# Patient Record
Sex: Female | Born: 1995 | Race: Asian | Hispanic: No | Marital: Single | Smoking: Never smoker
Health system: Southern US, Community
[De-identification: ages and names within clinical notes are randomized; demographics above are authoritative.]

---

## 2015-07-15 ENCOUNTER — Ambulatory Visit (INDEPENDENT_AMBULATORY_CARE_PROVIDER_SITE_OTHER): Payer: PRIVATE HEALTH INSURANCE | Admitting: Endocrinology

## 2015-07-15 ENCOUNTER — Encounter: Payer: Self-pay | Admitting: Endocrinology

## 2015-07-15 VITALS — BP 102/64 | HR 62 | Temp 97.9°F | Ht 64.0 in | Wt 114.0 lb

## 2015-07-15 DIAGNOSIS — E221 Hyperprolactinemia: Secondary | ICD-10-CM | POA: Diagnosis not present

## 2015-07-15 MED ORDER — BROMOCRIPTINE MESYLATE 2.5 MG PO TABS: 2.5000 mg | ORAL_TABLET | Freq: Every day | ORAL | Status: DC

## 2015-07-15 NOTE — Progress Notes (Signed)
Subjective:    Patient ID: Margaret Lowery, female    DOB: [birth year], 19 y.o.   MRN: [id number]  HPI Pt says she had menarche at age 43.  menses always have been variable, but less often than monthly.  She has slight assoc pain. She is G0.  She has no hair growth on the face.   Pt had menarche at age 25.  She has always had regular menses. She took OC's x a few mos, 2 years ago, but she did not have monthly bleeding then.  She was noted to have an elevated prolactin.  She denies the following: excessive exercise, opiates, antipsychotics, brain XRT, brain surgery, cirrhosis,  thyroid dz, renal failure, brain injury, urolithiasis, chest-wall injury, zoster, and seizures.  No past medical history on file.  No past surgical history on file.  Social History   Social History  . Marital Status: Single    Spouse Name: N/A  . Number of Children: N/A  . Years of Education: N/A   Occupational History  . Not on file.   Social History Main Topics  . Smoking status: Never Smoker   . Smokeless tobacco: Not on file  . Alcohol Use: No  . Drug Use: Not on file  . Sexual Activity: Not on file   Other Topics Concern  . Not on file   Social History Narrative  . No narrative on file    No current outpatient prescriptions on file prior to visit.   No current facility-administered medications on file prior to visit.    No Known Allergies  No family history on file.  BP 102/64 mmHg  Pulse 62  Temp(Src) 97.9 F (36.6 C) (Oral)  Ht  (1.626 m)  Wt 114 lb (51.71 kg)  BMI 19.56 kg/m2  SpO2 98%  LMP 07/02/2015  Review of Systems denies weight change, headache, fever, diarrhea, rash, visual loss, abdominal pain, sob, depression, urinary frequency, arthralgias, galactorrhea, cramps, excessive diaphoresis, n/v, rhinorrhea, easy bruising, and numbness.      Objective:   Physical Exam VS: see vs page GEN: no distress HEAD: head: no deformity eyes: no periorbital swelling, no  proptosis external nose and ears are normal mouth: no lesion seen NECK: supple, thyroid is not enlarged CHEST WALL: no deformity LUNGS:  Clear to auscultation CV: reg rate and rhythm, no murmur ABD: abdomen is soft, nontender.  no hepatosplenomegaly.  not distended.  no hernia MUSCULOSKELETAL: muscle bulk and strength are grossly normal.  no obvious joint swelling.  gait is normal and steady EXTEMITIES: no deformity.  no ulcer on the feet.  feet are of normal color and temp.  no edema PULSES: dorsalis pedis intact bilat.  no carotid bruit NEURO:  cn 2-12 grossly intact.   readily moves all 4's.  sensation is intact to touch on the feet SKIN:  Normal texture and temperature.  No rash or suspicious lesion is visible.   NODES:  None palpable at the neck PSYCH: alert, well-oriented.  Does not appear anxious nor depressed.   outside test results are reviewed: Prolactin=33 TSH=0.9 FSH=9.5 LH=9.9  I have reviewed outside records, and summarized: Pt was noted to have hypomenorrhea, and labs were checked.  When prol was noted to be high, she was advised to see a specialist     Assessment & Plan:  Hyperprolactinemia, mild, new Hypomenorrhea, ? related to prolactin  Patient is advised the following: Patient Instructions  Please start taking "bromocriptine." It has possible side effects of nausea and  dizziness.  These go away with time.  You can avoid these by taking it at bedtime, and by taking just take 1/2 pill for the first week.   This pill will make menstruation or pregnancy more possible, so may need to be prepared for sudden menstruation. Please redo the blood test in 1-2 months.  Please return in 1 year.

## 2015-07-15 NOTE — Patient Instructions (Signed)
Please start taking "bromocriptine." It has possible side effects of nausea and dizziness.  These go away with time.  You can avoid these by taking it at bedtime, and by taking just take 1/2 pill for the first week.   This pill will make menstruation or pregnancy more possible, so may need to be prepared for sudden menstruation. Please redo the blood test in 1-2 months.  Please return in 1 year.

## 2016-01-05 ENCOUNTER — Encounter (HOSPITAL_COMMUNITY): Payer: Self-pay

## 2016-01-05 ENCOUNTER — Emergency Department (HOSPITAL_COMMUNITY): Payer: Managed Care, Other (non HMO)

## 2016-01-05 ENCOUNTER — Emergency Department (HOSPITAL_COMMUNITY)
Admission: EM | Admit: 2016-01-05 | Discharge: 2016-01-06 | Disposition: A | Discharge status: Home / Self Care | Payer: Managed Care, Other (non HMO) | Attending: Emergency Medicine | Admitting: Emergency Medicine

## 2016-01-05 DIAGNOSIS — Y9289 Other specified places as the place of occurrence of the external cause: Secondary | ICD-10-CM | POA: Insufficient documentation

## 2016-01-05 DIAGNOSIS — S4992XA Unspecified injury of left shoulder and upper arm, initial encounter: Secondary | ICD-10-CM | POA: Diagnosis present

## 2016-01-05 DIAGNOSIS — X58XXXA Exposure to other specified factors, initial encounter: Secondary | ICD-10-CM | POA: Insufficient documentation

## 2016-01-05 DIAGNOSIS — Y9339 Activity, other involving climbing, rappelling and jumping off: Secondary | ICD-10-CM | POA: Diagnosis not present

## 2016-01-05 DIAGNOSIS — S43015A Anterior dislocation of left humerus, initial encounter: Secondary | ICD-10-CM | POA: Diagnosis not present

## 2016-01-05 DIAGNOSIS — Y998 Other external cause status: Secondary | ICD-10-CM | POA: Insufficient documentation

## 2016-01-05 MED ORDER — FENTANYL CITRATE (PF) 100 MCG/2ML IJ SOLN: 50.0000 ug | Freq: Once | INTRAMUSCULAR | Status: DC

## 2016-01-05 MED ORDER — ETOMIDATE 2 MG/ML IV SOLN
0.1500 mg/kg | Freq: Once | INTRAVENOUS | Status: AC
  Administered 2016-01-05: 8.16 mg via INTRAVENOUS
  Filled 2016-01-05: qty 10

## 2016-01-05 MED ORDER — FENTANYL CITRATE (PF) 100 MCG/2ML IJ SOLN
100.0000 ug | Freq: Once | INTRAMUSCULAR | Status: AC
Start: 2016-01-05 — End: 2016-01-05
  Administered 2016-01-05: 100 ug via INTRAVENOUS

## 2016-01-05 MED ORDER — FENTANYL CITRATE (PF) 100 MCG/2ML IJ SOLN
50.0000 ug | Freq: Once | INTRAMUSCULAR | Status: DC
  Filled 2016-01-05: qty 2

## 2016-01-05 MED ORDER — ETOMIDATE 2 MG/ML IV SOLN
INTRAVENOUS | Status: AC | PRN
  Administered 2016-01-05: 8.16 mg via INTRAVENOUS

## 2016-01-05 NOTE — ED Provider Notes (Signed)
CSN: [id number]     Arrival date & time 01/05/16  2130 History   First MD Initiated Contact with Patient 01/05/16 2154     Chief Complaint  Patient presents with  . Dislocation  . Shoulder Injury     (Consider location/radiation/quality/duration/timing/severity/associated sxs/prior Treatment) HPI   Patient is a 20 year old female who presents the emergency room with left shoulder pain and deformity that occurred approximately one and half hours ago while she was rock climbing.  She heard her left shoulder pop and had instant pain with obvious deformity.  She denies numbness and tingling, no color change to her arm. She is experiencing some muscle spasms in her left shoulder and rates her pain 8/10, worse with palpation or minimal movements.  No other acute complaints.  No other injuries.  NKDA.  Past medical hx or right shoulder dislocation "in high school."  She is going to college here and is from [place].  No local PCP.      History reviewed. No pertinent past medical history. History reviewed. No pertinent past surgical history. History reviewed. No pertinent family history. Social History  Substance Use Topics  . Smoking status: Never Smoker   . Smokeless tobacco: None  . Alcohol Use: No   OB History    No data available     Review of Systems  Constitutional: Negative.   Musculoskeletal: Negative for back pain, joint swelling, gait problem, neck pain and neck stiffness.  Neurological: Negative for weakness and numbness.  All other systems reviewed and are negative.   Allergies  Review of patient's allergies indicates no known allergies.  Home Medications   Prior to Admission medications   Medication Sig Start Date End Date Taking? Authorizing Provider  bromocriptine (PARLODEL) 2.5 MG tablet Take 1 tablet (2.5 mg total) by mouth at bedtime. Patient not taking: Reported on 01/05/2016 07/15/15   Romero Belling, MD  cyclobenzaprine (FLEXERIL) 10 MG tablet Take 1 tablet  (10 mg total) by mouth 2 (two) times daily as needed for muscle spasms. 01/06/16   Danelle Berry, PA-C  ibuprofen (ADVIL,MOTRIN) 800 MG tablet Take 1 tablet (800 mg total) by mouth 3 (three) times daily. 01/06/16   Danelle Berry, PA-C  traMADol (ULTRAM) 50 MG tablet Take 1 tablet (50 mg total) by mouth every 12 (twelve) hours as needed for severe pain. 01/06/16   Danelle Berry, PA-C   BP 110/54 mmHg  Pulse 89  Temp(Src) 98.3 F (36.8 C)  Resp 16  Ht  (1.626 m)  Wt 54.432 kg  BMI 20.59 kg/m2  SpO2 100%  LMP  (LMP Unknown) Physical Exam  Constitutional: She is oriented to person, place, and time. She appears well-developed and well-nourished. No distress.  Young female, appears stated age, looks uncomfortable holding left arm cradled across her abdomen  HENT:  Head: Normocephalic and atraumatic.  Right Ear: External ear normal.  Left Ear: External ear normal.  Nose: Nose normal.  Mouth/Throat: Oropharynx is clear and moist. No oropharyngeal exudate.  Eyes: Conjunctivae and EOM are normal. Pupils are equal, round, and reactive to light. Right eye exhibits no discharge. Left eye exhibits no discharge. No scleral icterus.  Neck: Normal range of motion. Neck supple. No JVD present. No tracheal deviation present.  Cardiovascular: Normal rate and regular rhythm.   Pulmonary/Chest: Effort normal and breath sounds normal. No stridor. No respiratory distress.  Musculoskeletal: She exhibits tenderness. She exhibits no edema.  Left shoulder deformity with sulcus sign, ttp, limited ROM 2+ radial and  ulnar pulse, brisk capillary refill, normal sensation to light touch throughout left arm  Lymphadenopathy:    She has no cervical adenopathy.  Neurological: She is alert and oriented to person, place, and time. She exhibits normal muscle tone. Coordination normal.  Skin: Skin is warm and dry. No rash noted. She is not diaphoretic. No erythema. No pallor.  Psychiatric: She has a normal mood and affect. Her  behavior is normal. Judgment and thought content normal.  Nursing note and vitals reviewed.   ED Course  Procedures (including critical care time) Labs Review Labs Reviewed - No data to display  Imaging Review Dg Shoulder Left  01/05/2016  CLINICAL DATA:  Left shoulder popped out of place while rock climbing. Initial encounter. EXAM: LEFT SHOULDER - 2+ VIEW COMPARISON:  None. FINDINGS: There is anterior dislocation of the left humeral head. An underlying Hill-Sachs lesion appears to be chronic in nature. No osseous Bankart lesion is seen. The left acromioclavicular joint is unremarkable. The visualized portions of the left lung are grossly clear. IMPRESSION: Anterior dislocation of the left humeral head. No acute fracture seen. Electronically Signed   By: Roanna RaiderJeffery  Chang M.D.   On: 01/05/2016 22:15   Dg Shoulder Left Port  01/05/2016  CLINICAL DATA:  Post reduction EXAM: LEFT SHOULDER - 1 VIEW COMPARISON:  Plain film from earlier same day. FINDINGS: Humeral head now appears well positioned relative to the glenoid fossa. No fracture line or displaced fracture fragment seen. IMPRESSION: Humeral head now well positioned relative to the glenoid fossa status post reduction. No fracture seen. Electronically Signed   By: Bary RichardStan  Maynard M.D.   On: 01/05/2016 23:59   I have personally reviewed and evaluated these images and lab results as part of my medical decision-making.   EKG Interpretation None      MDM   20 y/o female with left anterior shoulder dislocation w/o fx Pt was sedated and reduced by Dr. Read DriversMolpus - please see his documentation.  post reduction films shows well-positioned humeral head the glenohumeral fossa, no fractures or displacement noted. Patient had good pulses and sensation. She did have postprocedural pain and was given Valium, fentanyl and then Toradol with improvement.   She was placed in a sling immobilizer, given a school note, ortho follow up, pain meds, NSAIDS and muscle  relaxers.    She was discharged in good condition, VSS.  REturn precautions reviewed and acknowledged by the pt and by her friend at the bedside.  Final diagnoses:  Anterior shoulder dislocation, left, initial encounter      Danelle BerryLeisa Tapia, PA-C 01/06/16 0122  Paula LibraJohn Kynlee Koenigsberg, MD 01/06/16 [id number]

## 2016-01-05 NOTE — ED Provider Notes (Signed)
Medical screening examination/treatment/procedure(s) were conducted as a shared visit with non-physician practitioner(s) and myself.  I personally evaluated the patient during the encounter.  Left shoulder deformity consistent with anterior dislocation. This was confirmed radiographically.  Procedural sedation Performed by: Hanley SeamenMOLPUS,Chistian Kasler L Consent: Verbal consent obtained. Risks and benefits: risks, benefits and alternatives were discussed Required items: required blood products, implants, devices, and special equipment available Patient identity confirmed: arm band and provided demographic data Time out: Immediately prior to procedure a "time out" was called to verify the correct patient, procedure, equipment, support staff and site/side marked as required.  Sedation type: moderate (conscious) sedation NPO time confirmed and considedered  Sedatives: ETOMIDATE  Physician Time at Bedside: 15 minutes  Vitals: Vital signs were monitored during sedation. Cardiac Monitor, pulse oximeter Patient tolerance: Patient tolerated the procedure well with no immediate complications. Comments: Pt with uneventful recovered. Returned to pre-procedural sedation baseline  CLOSED REDUCTION After adequate sedation was obtained as described above the patient's left shoulder dislocation was reduced by hyperextension. The patient tolerated this well and there were no immediate complications. The left upper extremity remains neurovascularly intact. Postreduction x-ray was ordered.  Nursing notes and vitals signs, including pulse oximetry, reviewed.  Summary of this visit's results, reviewed by myself:  Imaging Studies: Dg Shoulder Left  01/05/2016  CLINICAL DATA:  Left shoulder popped out of place while rock climbing. Initial encounter. EXAM: LEFT SHOULDER - 2+ VIEW COMPARISON:  None. FINDINGS: There is anterior dislocation of the left humeral head. An underlying Hill-Sachs lesion appears to be chronic in  nature. No osseous Bankart lesion is seen. The left acromioclavicular joint is unremarkable. The visualized portions of the left lung are grossly clear. IMPRESSION: Anterior dislocation of the left humeral head. No acute fracture seen. Electronically Signed   By: Roanna RaiderJeffery  Chang M.D.   On: 01/05/2016 22:15   Dg Shoulder Left Port  01/05/2016  CLINICAL DATA:  Post reduction EXAM: LEFT SHOULDER - 1 VIEW COMPARISON:  Plain film from earlier same day. FINDINGS: Humeral head now appears well positioned relative to the glenoid fossa. No fracture line or displaced fracture fragment seen. IMPRESSION: Humeral head now well positioned relative to the glenoid fossa status post reduction. No fracture seen. Electronically Signed   By: Bary RichardStan  Maynard M.D.   On: 01/05/2016 23:59      Paula LibraJohn Sheilyn Boehlke, MD 01/06/16 [phone]

## 2016-01-05 NOTE — ED Notes (Signed)
Patient is conscious, alert, oriented x4. Patient is able to move all extremities

## 2016-01-05 NOTE — ED Notes (Signed)
Pt was rock climbing and heard her left shoulder pop, obvious deformity

## 2016-01-06 MED ORDER — FENTANYL CITRATE (PF) 100 MCG/2ML IJ SOLN
25.0000 ug | Freq: Once | INTRAMUSCULAR | Status: AC
  Administered 2016-01-06: 25 ug via INTRAVENOUS
  Filled 2016-01-06: qty 2

## 2016-01-06 MED ORDER — KETOROLAC TROMETHAMINE 30 MG/ML IJ SOLN
30.0000 mg | Freq: Once | INTRAMUSCULAR | Status: AC
  Administered 2016-01-06: 30 mg via INTRAVENOUS
  Filled 2016-01-06: qty 1

## 2016-01-06 MED ORDER — DIAZEPAM 5 MG/ML IJ SOLN
1.0000 mg | Freq: Once | INTRAMUSCULAR | Status: AC
  Administered 2016-01-06: 1 mg via INTRAVENOUS
  Filled 2016-01-06: qty 2

## 2016-01-06 MED ORDER — CYCLOBENZAPRINE HCL 10 MG PO TABS
10.0000 mg | ORAL_TABLET | Freq: Two times a day (BID) | ORAL | Status: DC | PRN
Start: 2016-01-06 — End: 2016-01-19

## 2016-01-06 MED ORDER — IBUPROFEN 800 MG PO TABS: 800.0000 mg | ORAL_TABLET | Freq: Three times a day (TID) | ORAL | Status: DC

## 2016-01-06 MED ORDER — TRAMADOL HCL 50 MG PO TABS: 50.0000 mg | ORAL_TABLET | Freq: Two times a day (BID) | ORAL | Status: DC | PRN

## 2016-01-06 NOTE — ED Notes (Signed)
Patient is conscious, alert, oriented x4.

## 2016-01-06 NOTE — Discharge Instructions (Signed)
Shoulder Dislocation °A shoulder dislocation happens when the upper arm bone (humerus) moves out of the shoulder joint. The shoulder joint is the part of the shoulder where the humerus, shoulder blade (scapula), and collarbone (clavicle) meet. °CAUSES °This condition is often caused by: °· A fall. °· A hit to the shoulder. °· A forceful movement of the shoulder. °RISK FACTORS °This condition is more likely to develop in people who play sports. °SYMPTOMS °Symptoms of this condition include: °· Deformity of the shoulder. °· Intense pain. °· Inability to move the shoulder. °· Numbness, weakness, or tingling in your neck or down your arm. °· Bruising or swelling around your shoulder. °DIAGNOSIS °This condition is diagnosed with a physical exam. After the exam, tests may be done to check for related problems. Tests that may be done include: °· X-ray. This may be done to check for broken bones. °· MRI. This may be done to check for damage to the tissues around the shoulder. °· Electromyogram. This may be done to check for nerve damage. °TREATMENT °This condition is treated with a procedure to place the humerus back in the joint. This procedure is called a reduction. There are two types of reduction: °· Closed reduction. In this procedure, the humerus is placed back in the joint without surgery. The health care provider uses his or her hands to guide the bone back into place. °· Open reduction. In this procedure, the humerus is placed back in the joint with surgery. An open reduction may be recommended if: °¨ You have a weak shoulder joint or weak ligaments. °¨ You have had more than one shoulder dislocation. °¨ The nerves or blood vessels around your shoulder have been damaged. °After the humerus is placed back into the joint, your arm will be placed in a splint or sling to prevent it from moving. You will need to wear the splint or sling until your shoulder heals. When the splint or sling is removed, you may have  physical therapy to help improve the range of motion in your shoulder joint. °HOME CARE INSTRUCTIONS °If You Have a Splint or Sling: °· Wear it as told by your health care provider. Remove it only as told by your health care provider. °· Loosen it if your fingers become numb and tingle, or if they turn cold and blue. °· Keep it clean and dry. °Bathing °· Do not take baths, swim, or use a hot tub until your health care provider approves. Ask your health care provider if you can take showers. You may only be allowed to take sponge baths for bathing. °· If your health care provider approves bathing and showering, cover your splint or sling with a watertight plastic bag to protect it from water. Do not let the splint or sling get wet. °Managing Pain, Stiffness, and Swelling °· If directed, apply ice to the injured area. °¨ Put ice in a plastic bag. °¨ Place a towel between your skin and the bag. °¨ Leave the ice on for 20 minutes, 2-3 times per day. °· Move your fingers often to avoid stiffness and to decrease swelling. °· Raise (elevate) the injured area above the level of your heart while you are sitting or lying down. °Driving °· Do not drive while wearing a splint or sling on a hand that you use for driving. °· Do not drive or operate heavy machinery while taking pain medicine. °Activity °· Return to your normal activities as told by your health care provider. Ask your   health care provider what activities are safe for you. °· Perform range-of-motion exercises only as told by your health care provider. °· Exercise your hand by squeezing a soft ball. This helps to decrease stiffness and swelling in your hand and wrist. °General Instructions °· Take over-the-counter and prescription medicines only as told by your health care provider. °· Do not use any tobacco products, including cigarettes, chewing tobacco, or e-cigarettes. Tobacco can delay bone and tissue healing. If you need help quitting, ask your health care  provider. °· Keep all follow-up visits as told by your health care provider. This is important. °SEEK MEDICAL CARE IF: °· Your splint or sling gets damaged. °SEEK IMMEDIATE MEDICAL CARE IF: °· Your pain gets worse rather than better. °· You lose feeling in your arm or hand. °· Your arm or hand becomes white and cold. °  °This information is not intended to replace advice given to you by your health care provider. Make sure you discuss any questions you have with your health care provider. °  °Document Released: 07/04/2001 Document Revised: 06/30/2015 Document Reviewed: 02/01/2015 °[employer]vier Interactive Patient Education ©2016 [employer] ° °

## 2016-01-06 NOTE — ED Notes (Signed)
Discharge instructions, follow up care, and rx x3 reviewed with patient. Patient verbalized understanding. 

## 2016-01-18 ENCOUNTER — Emergency Department (HOSPITAL_COMMUNITY)
Admission: EM | Admit: 2016-01-18 | Discharge: 2016-01-19 | Disposition: A | Discharge status: Home / Self Care | Payer: Managed Care, Other (non HMO) | Attending: Emergency Medicine | Admitting: Emergency Medicine

## 2016-01-18 ENCOUNTER — Emergency Department (HOSPITAL_COMMUNITY): Payer: Managed Care, Other (non HMO)

## 2016-01-18 ENCOUNTER — Encounter (HOSPITAL_COMMUNITY): Payer: Self-pay | Admitting: Emergency Medicine

## 2016-01-18 DIAGNOSIS — S43015A Anterior dislocation of left humerus, initial encounter: Secondary | ICD-10-CM | POA: Diagnosis not present

## 2016-01-18 DIAGNOSIS — X58XXXA Exposure to other specified factors, initial encounter: Secondary | ICD-10-CM | POA: Insufficient documentation

## 2016-01-18 DIAGNOSIS — Y9331 Activity, mountain climbing, rock climbing and wall climbing: Secondary | ICD-10-CM | POA: Insufficient documentation

## 2016-01-18 DIAGNOSIS — Y9289 Other specified places as the place of occurrence of the external cause: Secondary | ICD-10-CM | POA: Diagnosis not present

## 2016-01-18 DIAGNOSIS — Y998 Other external cause status: Secondary | ICD-10-CM | POA: Diagnosis not present

## 2016-01-18 DIAGNOSIS — S4992XA Unspecified injury of left shoulder and upper arm, initial encounter: Secondary | ICD-10-CM | POA: Diagnosis present

## 2016-01-18 MED ORDER — PROPOFOL 10 MG/ML IV BOLUS
0.5000 mg/kg | Freq: Once | INTRAVENOUS | Status: AC
  Administered 2016-01-19 (×3): 25 mg via INTRAVENOUS
  Filled 2016-01-18: qty 20

## 2016-01-18 MED ORDER — KETAMINE HCL-SODIUM CHLORIDE 100-0.9 MG/10ML-% IV SOSY
0.5000 mg/kg | PREFILLED_SYRINGE | Freq: Once | INTRAVENOUS | Status: AC
  Administered 2016-01-19: 25 mg via INTRAVENOUS
  Filled 2016-01-18: qty 10

## 2016-01-18 NOTE — ED Provider Notes (Signed)
CSN: [id number]     Arrival date & time 01/18/16  2300 History   By signing my name below, I, Margaret Lowery, attest that this documentation has been prepared under the direction and in the presence of [name], MD.  Electronically Signed: Arlan Lowery, ED Scribe. 01/18/2016. 11:21 PM.   Chief Complaint  Patient presents with  . Shoulder Injury   The history is provided by the patient. No language interpreter was used.    HPI Comments: Margaret Lowery is a 20 y.o. female without any pertinent past medical history who presents to the Emergency Department here for a L shoulder injury sustained approximately 1 hour prior to arrival. Pt states she was at the gym rock climbing when she noticed her shoulder was deformed and possibly dislocated. She is unsure of possible mechanism of injury. She now c/o constant, ongoing pain to the L shoulder which is exacerbated with movement. Discomfort is mildly alleviated when at rest. No recent fever or chills. Pt was evaluated in the Emergency Department on 3/15 for same. At that time, pt was sedated and joint was put back into place successfully. No known allergies to medications.  PCP: No primary care provider on file.    History reviewed. No pertinent past medical history. History reviewed. No pertinent past surgical history. History reviewed. No pertinent family history. Social History  Substance Use Topics  . Smoking status: Never Smoker   . Smokeless tobacco: None  . Alcohol Use: No   OB History    No data available     Review of Systems  Constitutional: Negative for fever and chills.  Respiratory: Negative for cough and shortness of breath.   Cardiovascular: Negative for chest pain.  Gastrointestinal: Negative for nausea, vomiting and abdominal pain.  Musculoskeletal: Positive for arthralgias.  Neurological: Negative for headaches.  Psychiatric/Behavioral: Negative for confusion.  All other systems reviewed and are  negative.     Allergies  Review of patient's allergies indicates no known allergies.  Home Medications   Prior to Admission medications   Medication Sig Start Date End Date Taking? Authorizing Provider  bromocriptine (PARLODEL) 2.5 MG tablet Take 1 tablet (2.5 mg total) by mouth at bedtime. Patient not taking: Reported on 01/05/2016 07/15/15   Romero Belling, MD  cyclobenzaprine (FLEXERIL) 10 MG tablet Take 1 tablet (10 mg total) by mouth 2 (two) times daily as needed for muscle spasms. Patient not taking: Reported on 01/18/2016 01/06/16   Danelle Berry, PA-C  ibuprofen (ADVIL,MOTRIN) 800 MG tablet Take 1 tablet (800 mg total) by mouth 3 (three) times daily. Patient not taking: Reported on 01/18/2016 01/06/16   Danelle Berry, PA-C  traMADol (ULTRAM) 50 MG tablet Take 1 tablet (50 mg total) by mouth every 12 (twelve) hours as needed for severe pain. Patient not taking: Reported on 01/18/2016 01/06/16   Danelle Berry, PA-C   Triage Vitals: BP 134/67 mmHg  Pulse 84  Temp(Src) 97.4 F (36.3 C) (Oral)  Resp 27  SpO2 100%  LMP 01/16/2016   Physical Exam  Constitutional: She is oriented to person, place, and time. She appears well-developed and well-nourished. No distress.  HENT:  Head: Normocephalic and atraumatic.  Right Ear: Hearing normal.  Left Ear: Hearing normal.  Nose: Nose normal.  Mouth/Throat: Oropharynx is clear and moist and mucous membranes are normal.  Eyes: Conjunctivae and EOM are normal. Pupils are equal, round, and reactive to light.  Neck: Normal range of motion. Neck supple.  Cardiovascular: Regular rhythm, S1 normal and S2  normal.  Exam reveals no gallop and no friction rub.   No murmur heard. Pulmonary/Chest: Effort normal and breath sounds normal. No respiratory distress. She exhibits no tenderness.  Abdominal: Soft. Normal appearance and bowel sounds are normal. There is no hepatosplenomegaly. There is no tenderness. There is no rebound, no guarding, no tenderness at  McBurney's point and negative Murphy's sign. No hernia.  Musculoskeletal: She exhibits tenderness.  L shoulder deformity  Decreased ROM due to pain   Neurological: She is alert and oriented to person, place, and time. She has normal strength. No cranial nerve deficit or sensory deficit. Coordination normal. GCS eye subscore is 4. GCS verbal subscore is 5. GCS motor subscore is 6.  Skin: Skin is warm, dry and intact. No rash noted. No cyanosis.  Psychiatric: She has a normal mood and affect. Her speech is normal and behavior is normal. Thought content normal.  Nursing note and vitals reviewed.   ED Course  .Sedation Date/Time: 01/19/2016 12:56 AM Performed by: Gilda CreasePOLLINA, Buffy Ehler J Authorized by: Gilda CreasePOLLINA, Navi Ewton J  Consent:    Consent obtained:  Written   Consent given by:  Patient   Risks discussed:  Prolonged sedation necessitating reversal and respiratory compromise necessitating ventilatory assistance and intubation Universal protocol:    Procedure explained and questions answered to patient or proxy's satisfaction: yes     Relevant documents present and verified: yes     Test results available and properly labeled: yes     Imaging studies available: yes     Required blood products, implants, devices, and special equipment available: yes     Site/side marked: yes     Immediately prior to procedure a time out was called: yes     Patient identity confirmation method:  Arm band and verbally with patient Indications:    Sedation purpose:  Dislocation reduction   Procedure necessitating sedation performed by:  Physician performing sedation   Intended level of sedation:  Moderate (conscious sedation) Pre-sedation assessment:    ASA classification: class 1 - normal, healthy patient     Neck mobility: normal     Mouth opening:  3 or more finger widths   Thyromental distance:  3 finger widths   Mallampati score:  I - soft palate, uvula, fauces, pillars visible   Pre-sedation  assessments completed and reviewed: airway patency, cardiovascular function, mental status, nausea/vomiting, pain level and respiratory function   Immediate pre-procedure details:    Reassessment: Patient reassessed immediately prior to procedure     Reviewed: vital signs     Verified: bag valve mask available, emergency equipment available, intubation equipment available, IV patency confirmed and oxygen available   Procedure details (see MAR for exact dosages):    Sedation start time:  01/19/2016 12:10 AM   Preoxygenation:  Room air   Sedation:  Ketamine and propofol   Intra-procedure monitoring:  Blood pressure monitoring, cardiac monitor, frequent vital sign checks, frequent LOC assessments and continuous pulse oximetry   Intra-procedure events: none     Sedation end time:  01/19/2016 12:55 AM Post-procedure details:    Post-sedation assessment completed:  01/19/2016 12:59 AM   Attendance: Constant attendance by certified staff until patient recovered     Recovery: Patient returned to pre-procedure baseline     Post-sedation assessments completed and reviewed: airway patency, cardiovascular function, mental status, nausea/vomiting, pain level and respiratory function     Patient is stable for discharge or admission: Yes     Patient tolerance:  Tolerated well, no immediate  complications ORTHOPEDIC INJURY TREATMENT Date/Time: 01/19/2016 12:10 AM Performed by: [name] Authorized by: [name] Consent: Verbal consent obtained. Written consent obtained. Risks and benefits: risks, benefits and alternatives were discussed Consent given by: patient Patient understanding: patient states understanding of the procedure being performed Patient consent: the patient's understanding of the procedure matches consent given Procedure consent: procedure consent matches procedure scheduled Relevant documents: relevant documents present and verified Test results: test results  available and properly labeled Site marked: the operative site was marked Imaging studies: imaging studies available Required items: required blood products, implants, devices, and special equipment available Patient identity confirmed: arm band and verbally with patient Time out: Immediately prior to procedure a "time out" was called to verify the correct patient, procedure, equipment, support staff and site/side marked as required. Injury location: shoulder Location details: left shoulder Injury type: dislocation Hill-Sachs deformity: no Chronicity: recurrent Pre-procedure neurovascular assessment: neurovascularly intact Pre-procedure distal perfusion: normal Pre-procedure neurological function: normal Pre-procedure range of motion: reduced Local anesthesia used: no Patient sedated: yes Sedatives: ketamine and propofol Sedation start date/time: 01/19/2016 12:10 AM Sedation end date/time: 01/19/2016 12:55 AM Manipulation performed: yes Reduction method: external rotation Reduction successful: yes X-ray confirmed reduction: yes Immobilization: sling Post-procedure neurovascular assessment: post-procedure neurovascularly intact Post-procedure distal perfusion: normal Post-procedure neurological function: normal Post-procedure range of motion: improved Patient tolerance: Patient tolerated the procedure well with no immediate complications   (including critical care time)  DIAGNOSTIC STUDIES: Oxygen Saturation is 100% on RA, Mpr,a; by my interpretation.    COORDINATION OF CARE: 11:14 PM- Will order imaging. Discussed treatment plan with pt at bedside and pt agreed to plan.     Labs Review Labs Reviewed - No data to display  Imaging Review Dg Shoulder Left  01/18/2016  CLINICAL DATA:  Acute onset of left shoulder pain and deformity after rock climbing. Initial encounter. EXAM: LEFT SHOULDER - 2+ VIEW COMPARISON:  Left shoulder radiographs performed 01/05/2016 FINDINGS: There is  anterior dislocation of the left humeral head, likely lodged against the glenoid. No definite fractures are seen. Evaluation for Hill-Sachs lesion is limited given limitations in positioning. No osseous Bankart lesion is identified. The left acromioclavicular joint is unremarkable. The visualized portions of the lungs are grossly clear. No definite soft tissue abnormalities are characterized on radiograph. IMPRESSION: Anterior dislocation of the left humeral head, likely lodged against the glenoid. No definite fracture seen. Evaluation for Hill-Sachs lesion is limited given limitations in positioning. No osseous Bankart lesion seen. Electronically Signed   By: Roanna Raider M.D.   On: 01/18/2016 23:56   I have personally reviewed and evaluated these images and lab results as part of my medical decision-making.   EKG Interpretation None      MDM   Final diagnoses:  Anterior shoulder dislocation, left, initial encounter    Patient presents to the ER for evaluation of possible shoulder dislocation. Patient was rock climbing when she felt sudden onset of pain of her left shoulder. Patient was seen in this ER 2 weeks ago for shoulder dislocation requiring sedation for closed reduction. She has not worn the immobilizer nor attempted follow-up.  Presentation x-ray did confirm dislocation without fracture. Patient was consented for repeat procedural sedation and closed reduction. Procedure was performed without difficulty. Patient was counseled that she needs to maintain shoulder immobilizer and follow-up with orthopedics before removal.  I personally performed the services described in this documentation, which was scribed in my presence. The recorded information has been reviewed and is  accurate.   [name], MD 01/19/16 [phone]

## 2016-01-18 NOTE — ED Notes (Signed)
Pt states that she feels she has dislocated her L shoulder. Shoulder appears deformed. Hx of same several days ago. Alert and oriented.

## 2016-01-19 ENCOUNTER — Emergency Department (HOSPITAL_COMMUNITY): Payer: Managed Care, Other (non HMO)

## 2016-01-19 MED ORDER — IBUPROFEN 800 MG PO TABS: 800.0000 mg | ORAL_TABLET | Freq: Three times a day (TID) | ORAL | Status: AC

## 2016-01-19 MED ORDER — TRAMADOL HCL 50 MG PO TABS: 50.0000 mg | ORAL_TABLET | Freq: Four times a day (QID) | ORAL | Status: AC | PRN

## 2016-11-13 ENCOUNTER — Other Ambulatory Visit (HOSPITAL_BASED_OUTPATIENT_CLINIC_OR_DEPARTMENT_OTHER): Payer: Self-pay | Admitting: Emergency Medicine

## 2016-11-13 ENCOUNTER — Ambulatory Visit (HOSPITAL_BASED_OUTPATIENT_CLINIC_OR_DEPARTMENT_OTHER)
Admission: RE | Admit: 2016-11-13 | Discharge: 2016-11-13 | Disposition: A | Discharge status: Home / Self Care | Payer: Managed Care, Other (non HMO) | Source: Ambulatory Visit | Attending: Emergency Medicine | Admitting: Emergency Medicine

## 2016-11-13 DIAGNOSIS — R1031 Right lower quadrant pain: Secondary | ICD-10-CM | POA: Diagnosis not present

## 2016-11-13 MED ORDER — IOPAMIDOL (ISOVUE-300) INJECTION 61%
100.0000 mL | Freq: Once | INTRAVENOUS | Status: AC | PRN
  Administered 2016-11-13: 100 mL via INTRAVENOUS

## 2017-06-19 IMAGING — CT CT ABD-PELV W/ CM
2 of 4 series · 16 of 46 positions shown, 18 images · IV contrast (APPLIED)
Comparison: None.

CLINICAL DATA: Acute onset of right lower quadrant abdominal pain.
Initial encounter.

EXAM:
CT ABDOMEN AND PELVIS WITH CONTRAST
TECHNIQUE: Multidetector CT imaging of the abdomen and pelvis was performed
using the standard protocol following bolus administration of
intravenous contrast.
CONTRAST:  100mL [id number] IOPAMIDOL ([id number]) INJECTION 61%

[Series 2: axial st · axial · 0.73mm/px · z∈[-568,-138]mm · 13 of 94 slices shown, 15 images]
[im 4/94  soft-tissue]
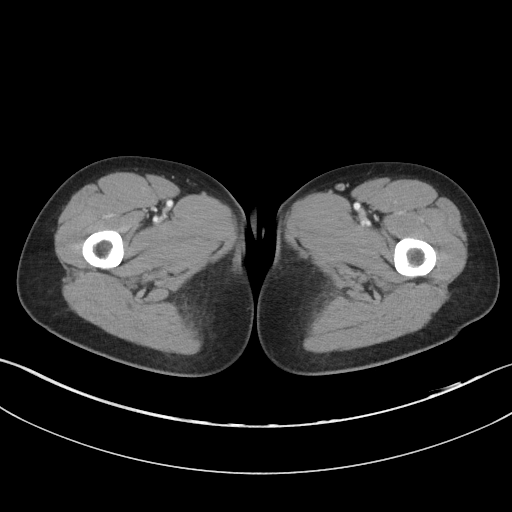
[im 4/94  bone]
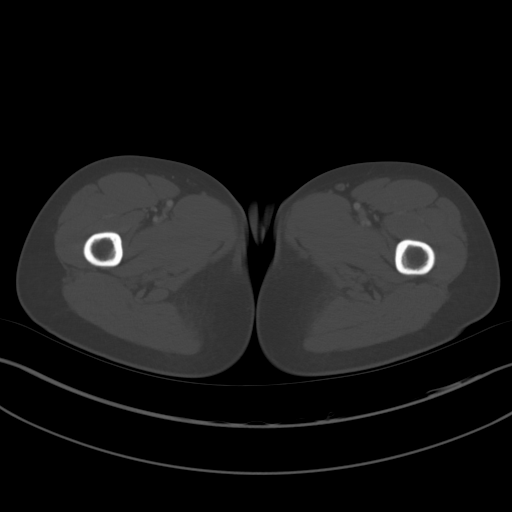
[im 12/94  soft-tissue]
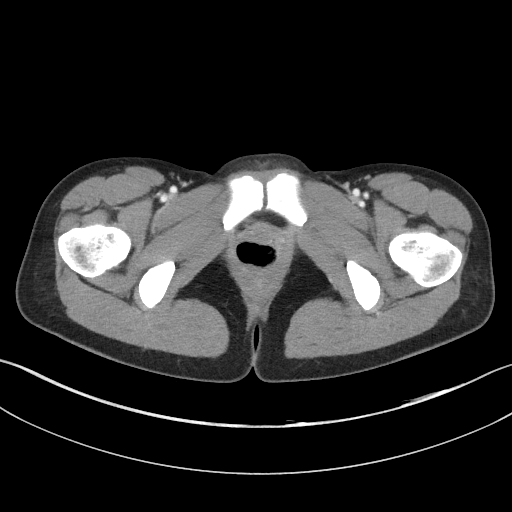
[im 20/94  soft-tissue]
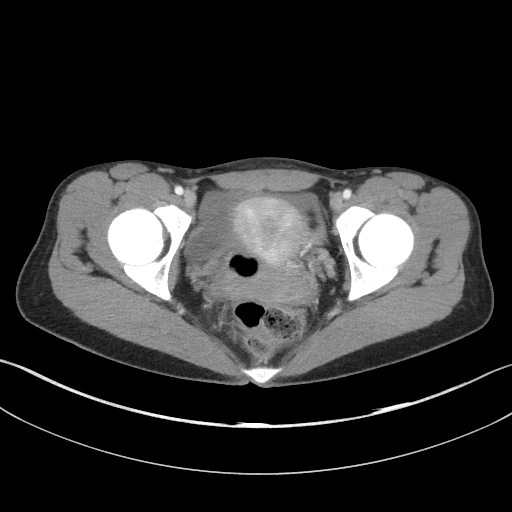
[im 28/94  soft-tissue]
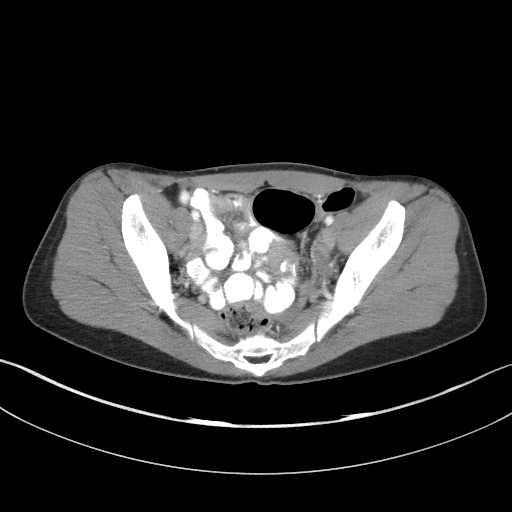
[im 32/94  soft-tissue]
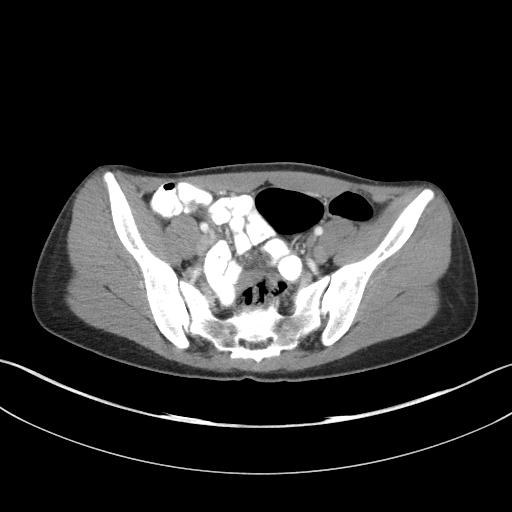
[im 39/94  soft-tissue]
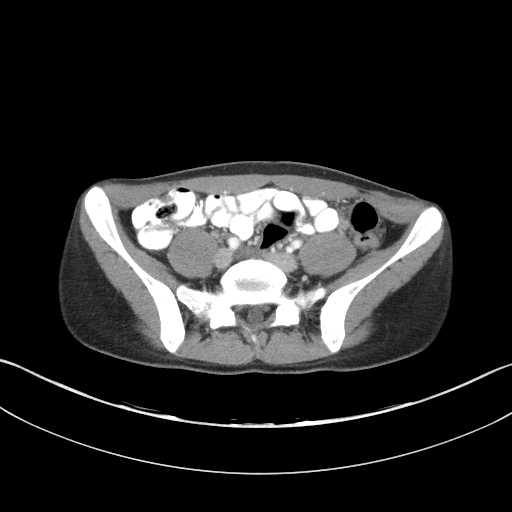
[im 47/94  soft-tissue]
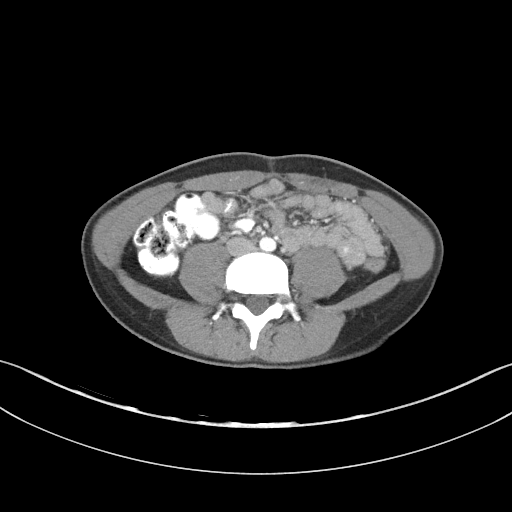
[im 55/94  soft-tissue]
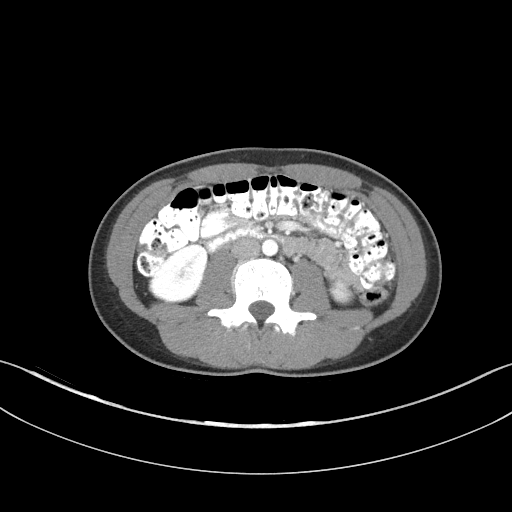
[im 63/94  soft-tissue]
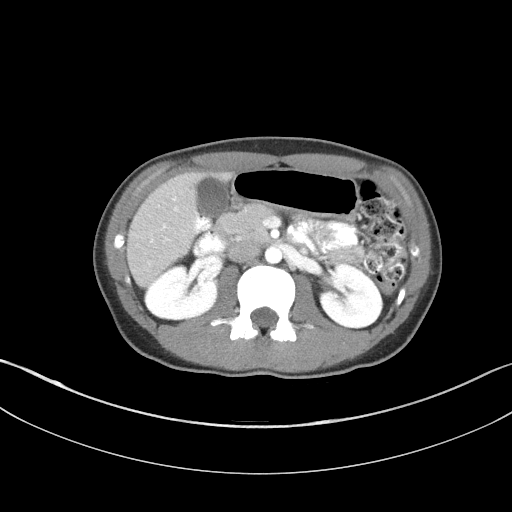
[im 63/94  bone]
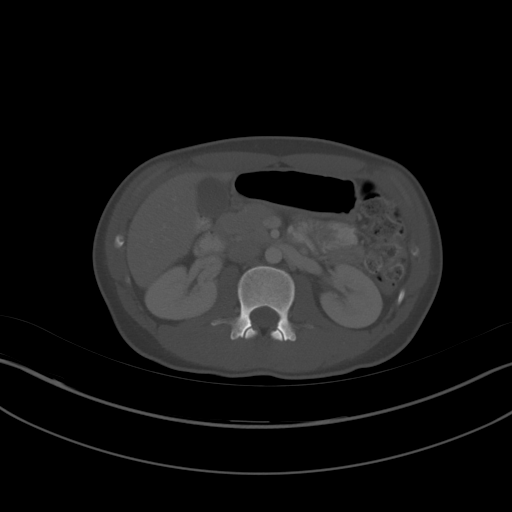
[im 66/94  soft-tissue]
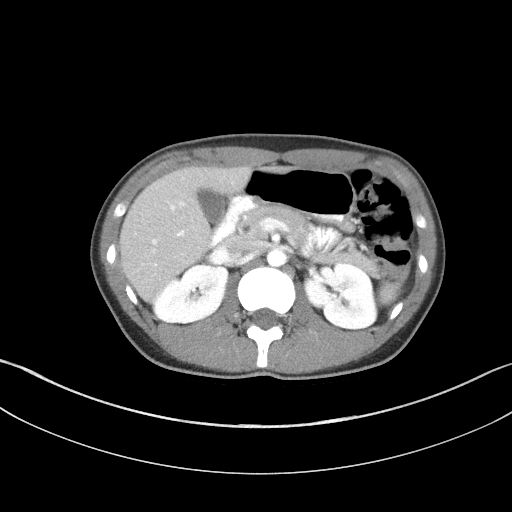
[im 74/94  soft-tissue]
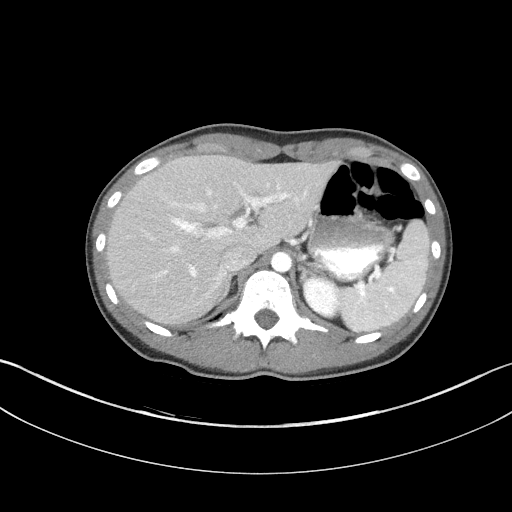
[im 82/94  soft-tissue]
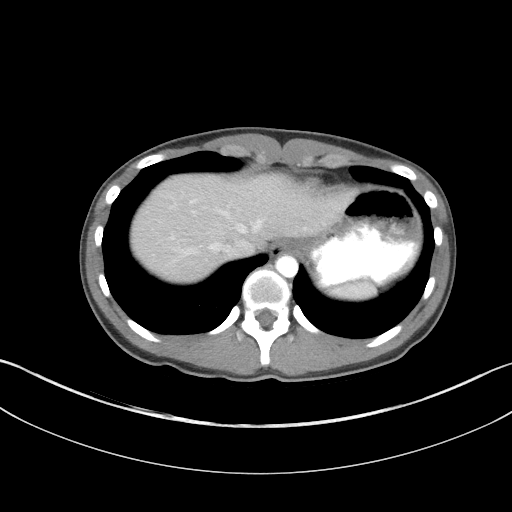
[im 90/94  soft-tissue]
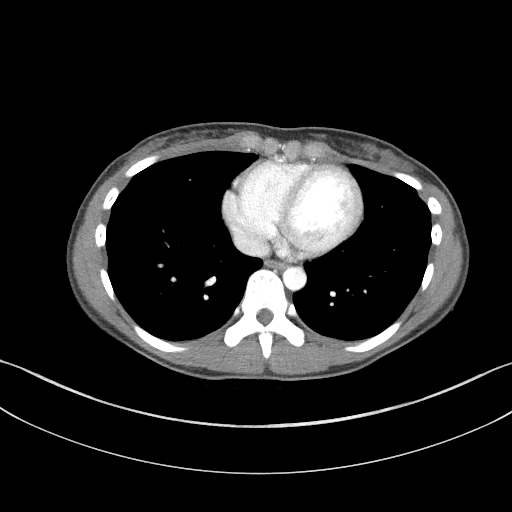

[Series 5: coronal st · coronal · 0.78mm/px · 3 of 60 slices shown]
[im 20/60  soft-tissue]
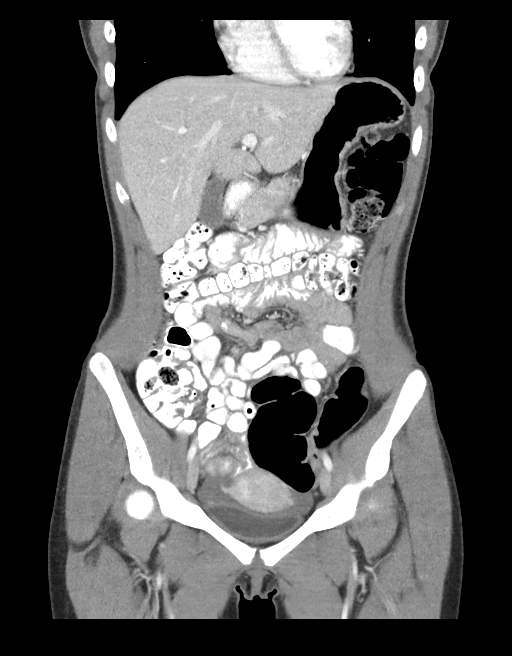
[im 27/60  soft-tissue]
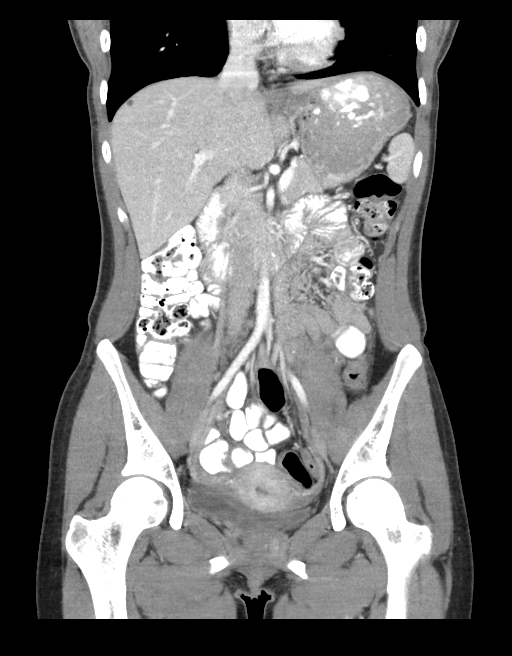
[im 33/60  soft-tissue]
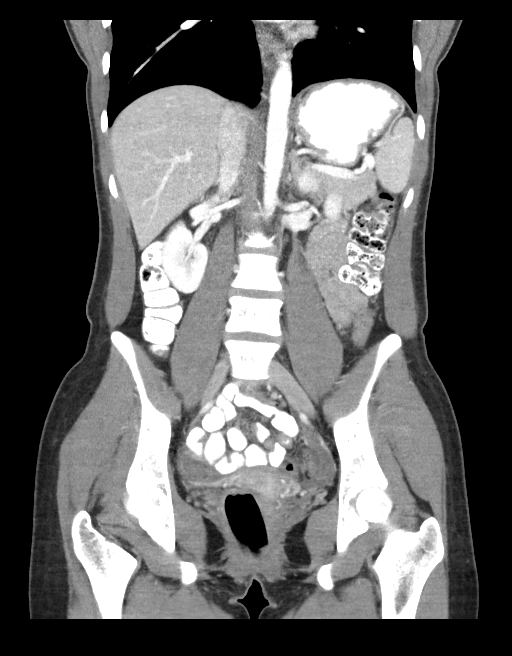

[16 of 46 positions shown; findings below may reference images not displayed]

FINDINGS: Lower chest: The visualized lung bases are grossly clear. The
visualized portions of the mediastinum are unremarkable.

Hepatobiliary: The liver is unremarkable in appearance. The
gallbladder is unremarkable in appearance. The common bile duct
remains normal in caliber.

Pancreas: The pancreas is within normal limits.

Spleen: The spleen is unremarkable in appearance.

Adrenals/Urinary Tract: The adrenal glands are unremarkable in
appearance. The kidneys are within normal limits. There is no
evidence of hydronephrosis. No renal or ureteral stones are
identified. No perinephric stranding is seen.

Stomach/Bowel: The stomach is unremarkable in appearance. The small
bowel is within normal limits. The appendix is normal in caliber,
without evidence of appendicitis. The colon is unremarkable in
appearance.

Vascular/Lymphatic: The abdominal aorta is unremarkable in
appearance. The inferior vena cava is grossly unremarkable. No
retroperitoneal lymphadenopathy is seen. No pelvic sidewall
lymphadenopathy is identified.

Reproductive: The bladder is mildly distended and within normal
limits. The uterus is grossly unremarkable in appearance. The
ovaries are relatively symmetric. No suspicious adnexal masses are
seen. A tampon is noted at the vagina.

Other: No additional soft tissue abnormalities are seen.

Musculoskeletal: No acute osseous abnormalities are identified. The
visualized musculature is unremarkable in appearance.
IMPRESSION: No acute abnormality seen within the abdomen or pelvis.

## 2020-03-23 ENCOUNTER — Encounter: Payer: Self-pay | Admitting: "Endocrinology
